# Patient Record
Sex: Male | Born: 1975 | Race: Black or African American | Hispanic: No | Marital: Single | Smoking: Never smoker
Health system: Southern US, Community
[De-identification: ages and names within clinical notes are randomized; demographics above are authoritative.]

## PROBLEM LIST (undated history)

## (undated) HISTORY — PX: CYST EXCISION: SHX5701

## (undated) HISTORY — PX: HERNIA REPAIR: SHX51

## (undated) HISTORY — PX: TONSILLECTOMY: SUR1361

---

## 2016-01-21 ENCOUNTER — Emergency Department (HOSPITAL_BASED_OUTPATIENT_CLINIC_OR_DEPARTMENT_OTHER): Payer: Self-pay

## 2016-01-21 ENCOUNTER — Emergency Department (HOSPITAL_BASED_OUTPATIENT_CLINIC_OR_DEPARTMENT_OTHER)
Admission: EM | Admit: 2016-01-21 | Discharge: 2016-01-21 | Disposition: A | Discharge status: Home / Self Care | Payer: Self-pay | Attending: Emergency Medicine | Admitting: Emergency Medicine

## 2016-01-21 ENCOUNTER — Encounter (HOSPITAL_BASED_OUTPATIENT_CLINIC_OR_DEPARTMENT_OTHER): Payer: Self-pay | Admitting: Emergency Medicine

## 2016-01-21 DIAGNOSIS — M5442 Lumbago with sciatica, left side: Secondary | ICD-10-CM | POA: Insufficient documentation

## 2016-01-21 DIAGNOSIS — M5432 Sciatica, left side: Secondary | ICD-10-CM

## 2016-01-21 MED ORDER — PREDNISONE 50 MG PO TABS
60.0000 mg | ORAL_TABLET | Freq: Once | ORAL | Status: AC
  Administered 2016-01-21: 60 mg via ORAL
  Filled 2016-01-21: qty 1

## 2016-01-21 MED ORDER — KETOROLAC TROMETHAMINE 60 MG/2ML IM SOLN
60.0000 mg | Freq: Once | INTRAMUSCULAR | Status: AC
  Administered 2016-01-21: 60 mg via INTRAMUSCULAR
  Filled 2016-01-21: qty 2

## 2016-01-21 MED ORDER — CYCLOBENZAPRINE HCL 10 MG PO TABS: 10.0000 mg | ORAL_TABLET | Freq: Three times a day (TID) | ORAL | Status: DC | PRN

## 2016-01-21 MED ORDER — OXYCODONE-ACETAMINOPHEN 5-325 MG PO TABS
1.0000 | ORAL_TABLET | Freq: Once | ORAL | Status: AC
  Administered 2016-01-21: 1 via ORAL
  Filled 2016-01-21: qty 1

## 2016-01-21 MED ORDER — PREDNISONE 50 MG PO TABS: 50.0000 mg | ORAL_TABLET | Freq: Every day | ORAL | Status: DC

## 2016-01-21 MED ORDER — TRAMADOL HCL 50 MG PO TABS: 50.0000 mg | ORAL_TABLET | Freq: Four times a day (QID) | ORAL | Status: DC | PRN

## 2016-01-21 NOTE — Discharge Instructions (Signed)
Return here as needed.  Use ice and heat on your lower back.  Follow-up with the orthopedist provided

## 2016-01-21 NOTE — ED Provider Notes (Signed)
CSN: [id number]     Arrival date & time 01/21/16  0930 History   First MD Initiated Contact with Patient 01/21/16 0940     Chief Complaint  Patient presents with  . Back Pain     (Consider location/radiation/quality/duration/timing/severity/associated sxs/prior Treatment) HPI Patient presents to the emergency department with lower back pain that radiates into the left lower extremity.  Patient states he was washing his car on Sunday when he bent over and felt a sudden sharp pain in his lower back.  He states since that time he has had worsening pain.  He states that reading to his left lower extremity.  The patient states that he did not take any medications prior to arrival.  Nothing seems to make the condition better.  He says movement and palpation make the pain worse.  He states that today he felt like he was more stiff and not is able to move. The patient denies chest pain, shortness of breath, headache,blurred vision, neck pain, fever, cough, weakness, numbness, dizziness, anorexia, edema, abdominal pain, nausea, vomiting, diarrhea, rash, back pain, dysuria, hematemesis, bloody stool, near syncope, or syncope. No past medical history on file. No past surgical history on file. No family history on file. Social History  Substance Use Topics  . Smoking status: Never Smoker   . Smokeless tobacco: None  . Alcohol Use: None    Review of Systems  All other systems negative except as documented in the HPI. All pertinent positives and negatives as reviewed in the HPI.  Allergies  Review of patient's allergies indicates no known allergies.  Home Medications   Prior to Admission medications   Not on File   BP 129/85 mmHg  Temp(Src) 98.2 F (36.8 C) (Oral)  Resp 16  Ht  (1.854 m)  Wt 107.502 kg  BMI 31.27 kg/m2  SpO2 99% Physical Exam  Constitutional: He is oriented to person, place, and time. He appears well-developed and well-nourished. No distress.  HENT:  Head:  Normocephalic and atraumatic.  Mouth/Throat: Oropharynx is clear and moist.  Eyes: Pupils are equal, round, and reactive to light.  Neck: Normal range of motion. Neck supple.  Cardiovascular: Normal rate, regular rhythm and normal heart sounds.  Exam reveals no gallop and no friction rub.   No murmur heard. Pulmonary/Chest: Effort normal and breath sounds normal. No respiratory distress. He has no wheezes.  Musculoskeletal:       Lumbar back: He exhibits tenderness and pain. He exhibits normal range of motion, no bony tenderness, no swelling, no edema and no spasm.       Back:  Neurological: He is alert and oriented to person, place, and time. He has normal strength. No cranial nerve deficit or sensory deficit. He exhibits normal muscle tone. Coordination and gait normal. GCS eye subscore is 4. GCS verbal subscore is 5. GCS motor subscore is 6.  Skin: Skin is warm and dry. No rash noted. No erythema.  Psychiatric: He has a normal mood and affect. His behavior is normal.  Nursing note and vitals reviewed.   ED Course  Procedures (including critical care time) Labs Review Labs Reviewed - No data to display  Imaging Review Dg Lumbar Spine Complete  01/21/2016  CLINICAL DATA:  Three-day history of lumbago.  No known injury EXAM: LUMBAR SPINE - COMPLETE 4+ VIEW COMPARISON:  None. FINDINGS: Frontal, lateral, spot lumbosacral lateral, and bilateral oblique views were obtained. There are 5 non-rib-bearing lumbar type vertebral bodies. There is no fracture or spondylolisthesis.  The disc spaces appear normal. There is no appreciable facet arthropathy. IMPRESSION: No fracture or spondylolisthesis. No appreciable arthropathic change. Electronically Signed   By: Bretta BangWilliam  Woodruff III M.D.   On: 01/21/2016 10:37   I have personally reviewed and evaluated these images and lab results as part of my medical decision-making.   EKG Interpretation None       Patient be treated for sciatica advised  follow-up with a primary care Dr. told to use ice and heat on his lower back muscles.  Advised him to avoid heavy lifting and bending over to pick things up at the waist.  Patient agrees the plan and all questions were answered  Charlestine NightChristopher Erian Lariviere, PA-C 01/21/16 1056  Geoffery Lyonsouglas Delo, MD 01/21/16 1057

## 2016-01-21 NOTE — ED Notes (Signed)
Lower back pain radiating down left leg.  No known injury.  No numbness or tingling.  No elimination problems.

## 2016-10-21 ENCOUNTER — Emergency Department (HOSPITAL_BASED_OUTPATIENT_CLINIC_OR_DEPARTMENT_OTHER)
Admission: EM | Admit: 2016-10-21 | Discharge: 2016-10-21 | Disposition: A | Discharge status: Home / Self Care | Payer: 59 | Attending: Emergency Medicine | Admitting: Emergency Medicine

## 2016-10-21 ENCOUNTER — Encounter (HOSPITAL_BASED_OUTPATIENT_CLINIC_OR_DEPARTMENT_OTHER): Payer: Self-pay | Admitting: Emergency Medicine

## 2016-10-21 DIAGNOSIS — R109 Unspecified abdominal pain: Secondary | ICD-10-CM | POA: Insufficient documentation

## 2016-10-21 DIAGNOSIS — M791 Myalgia: Secondary | ICD-10-CM | POA: Diagnosis not present

## 2016-10-21 DIAGNOSIS — R110 Nausea: Secondary | ICD-10-CM | POA: Diagnosis not present

## 2016-10-21 DIAGNOSIS — Z79899 Other long term (current) drug therapy: Secondary | ICD-10-CM | POA: Diagnosis not present

## 2016-10-21 DIAGNOSIS — R197 Diarrhea, unspecified: Secondary | ICD-10-CM | POA: Insufficient documentation

## 2016-10-21 MED ORDER — DICYCLOMINE HCL 10 MG PO CAPS
10.0000 mg | ORAL_CAPSULE | Freq: Once | ORAL | Status: AC
  Administered 2016-10-21: 10 mg via ORAL
  Filled 2016-10-21: qty 1

## 2016-10-21 MED ORDER — DICYCLOMINE HCL 20 MG PO TABS
20.0000 mg | ORAL_TABLET | Freq: Two times a day (BID) | ORAL | 0 refills | Status: DC | PRN
Start: 2016-10-21 — End: 2019-10-27

## 2016-10-21 MED ORDER — ONDANSETRON HCL 4 MG PO TABS: 4.0000 mg | ORAL_TABLET | Freq: Three times a day (TID) | ORAL | 0 refills | Status: DC | PRN

## 2016-10-21 MED ORDER — ONDANSETRON 4 MG PO TBDP
4.0000 mg | ORAL_TABLET | Freq: Once | ORAL | Status: AC
  Administered 2016-10-21: 4 mg via ORAL
  Filled 2016-10-21: qty 1

## 2016-10-21 NOTE — ED Triage Notes (Signed)
Nausea and frequent belching with diarrhea since yesterday. Abd cramping

## 2016-10-21 NOTE — ED Provider Notes (Signed)
[facility]   CSN: [id number] Arrival date & time: 10/21/16  0805     History   Chief Complaint Chief Complaint  Patient presents with  . Diarrhea    HPI Danny Robinson is a 41 y.o. male.  HPI Patient presents with 2 days of diarrhea. States the diarrhea is nonbloody and nonbilious. Had 3-4 episodes yesterday and one this morning. Associated with stomach cramps that improved after bowel movement. He's also had some nausea but denies any vomiting. He does admit to belching. Denies fever but has had some chills. Denies any recent travel or food intake that was questionable. States that there are several people at his work have been sick with similar symptoms. History reviewed. No pertinent past medical history.  There are no active problems to display for this patient.   Past Surgical History:  Procedure Laterality Date  . CYST EXCISION         Home Medications    Prior to Admission medications   Medication Sig Start Date End Date Taking? Authorizing Provider  cyclobenzaprine (FLEXERIL) 10 MG tablet Take 1 tablet (10 mg total) by mouth 3 (three) times daily as needed for muscle spasms. 01/21/16   Charlestine Night, PA-C  dicyclomine (BENTYL) 20 MG tablet Take 1 tablet (20 mg total) by mouth 2 (two) times daily as needed for spasms. 10/21/16   Loren Racer, MD  ondansetron (ZOFRAN) 4 MG tablet Take 1 tablet (4 mg total) by mouth every 8 (eight) hours as needed for nausea or vomiting. 10/21/16   Loren Racer, MD  predniSONE (DELTASONE) 50 MG tablet Take 1 tablet (50 mg total) by mouth daily with breakfast. 01/21/16   Charlestine Night, PA-C  traMADol (ULTRAM) 50 MG tablet Take 1 tablet (50 mg total) by mouth every 6 (six) hours as needed for severe pain. 01/21/16   Charlestine Night, PA-C    Family History No family history on file.  Social History Social History  Substance Use Topics  . Smoking status: Never Smoker  . Smokeless tobacco:  Never Used  . Alcohol use No     Allergies   Patient has no known allergies.   Review of Systems Review of Systems  Constitutional: Positive for chills. Negative for fever.  HENT: Negative for congestion, rhinorrhea and sore throat.   Respiratory: Negative for cough and shortness of breath.   Cardiovascular: Negative for chest pain.  Gastrointestinal: Positive for abdominal pain, diarrhea and nausea. Negative for abdominal distention, blood in stool and vomiting.  Genitourinary: Negative for dysuria and flank pain.  Musculoskeletal: Positive for myalgias. Negative for arthralgias, neck pain and neck stiffness.  Skin: Negative for rash and wound.  Neurological: Negative for dizziness, weakness, light-headedness, numbness and headaches.  All other systems reviewed and are negative.    Physical Exam Updated Vital Signs BP 143/77 (BP Location: Left Arm)   Pulse 70   Temp 98.1 F (36.7 C) (Oral)   Resp 18   Ht 6\' 1"  (1.854 m)   Wt 245 lb (111.1 kg)   SpO2 99%   BMI 32.32 kg/m   Physical Exam  Constitutional: He is oriented to person, place, and time. He appears well-developed and well-nourished.  HENT:  Head: Normocephalic and atraumatic.  Mouth/Throat: Oropharynx is clear and moist. No oropharyngeal exudate.  Eyes: EOM are normal. Pupils are equal, round, and reactive to light.  Neck: Normal range of motion. Neck supple.  No meningismus  Cardiovascular: Normal rate and regular rhythm.  Exam reveals  no gallop and no friction rub.   No murmur heard. Pulmonary/Chest: Effort normal and breath sounds normal. No respiratory distress. He has no wheezes. He has no rales. He exhibits no tenderness.  Abdominal: Soft. Bowel sounds are normal. There is no tenderness. There is no rebound and no guarding.  Abdomen is soft and nontender. No rebound or guarding. Increased bowel sounds.  Musculoskeletal: Normal range of motion. He exhibits no edema or tenderness.  No CVA tenderness. No  midline thoracic or lumbar tenderness. No lower extremity swelling or asymmetry.  Lymphadenopathy:    He has no cervical adenopathy.  Neurological: He is alert and oriented to person, place, and time.  Skin: Skin is warm and dry. Capillary refill takes less than 2 seconds. No rash noted. No erythema.  Psychiatric: He has a normal mood and affect. His behavior is normal.  Nursing note and vitals reviewed.    ED Treatments / Results  Labs (all labs ordered are listed, but only abnormal results are displayed) Labs Reviewed - No data to display  EKG  EKG Interpretation None       Radiology No results found.  Procedures Procedures (including critical care time)  Medications Ordered in ED Medications  ondansetron (ZOFRAN-ODT) disintegrating tablet 4 mg (4 mg Oral Given 10/21/16 0914)  dicyclomine (BENTYL) capsule 10 mg (10 mg Oral Given 10/21/16 0915)     Initial Impression / Assessment and Plan / ED Course  I have reviewed the triage vital signs and the nursing notes.  Pertinent labs & imaging results that were available during my care of the patient were reviewed by me and considered in my medical decision making (see chart for details).     Likely enteritis. We'll treat symptomatically. Patient appears well-hydrated. Encouraged to continue to drink plenty of fluids. Abdominal exam is benign. Low suspicion for surgical emergency. Return precautions have been given.  Final Clinical Impressions(s) / ED Diagnoses   Final diagnoses:  Diarrhea, unspecified type    New Prescriptions New Prescriptions   DICYCLOMINE (BENTYL) 20 MG TABLET    Take 1 tablet (20 mg total) by mouth 2 (two) times daily as needed for spasms.   ONDANSETRON (ZOFRAN) 4 MG TABLET    Take 1 tablet (4 mg total) by mouth every 8 (eight) hours as needed for nausea or vomiting.     Loren Raceravid Oneta Sigman, MD 10/21/16 [id number]

## 2017-05-22 ENCOUNTER — Emergency Department (HOSPITAL_BASED_OUTPATIENT_CLINIC_OR_DEPARTMENT_OTHER)
Admission: EM | Admit: 2017-05-22 | Discharge: 2017-05-22 | Disposition: A | Discharge status: Home / Self Care | Payer: 59 | Attending: Emergency Medicine | Admitting: Emergency Medicine

## 2017-05-22 ENCOUNTER — Encounter (HOSPITAL_BASED_OUTPATIENT_CLINIC_OR_DEPARTMENT_OTHER): Payer: Self-pay

## 2017-05-22 DIAGNOSIS — Z79899 Other long term (current) drug therapy: Secondary | ICD-10-CM | POA: Insufficient documentation

## 2017-05-22 DIAGNOSIS — M79674 Pain in right toe(s): Secondary | ICD-10-CM | POA: Diagnosis present

## 2017-05-22 DIAGNOSIS — B353 Tinea pedis: Secondary | ICD-10-CM | POA: Insufficient documentation

## 2017-05-22 NOTE — ED Provider Notes (Signed)
[facility]   CSN: [id number] Arrival date & time: 05/22/17  1102     History   Chief Complaint Chief Complaint  Patient presents with  . Toe Pain    HPI Danny Robinson is a 41 y.o. male who presents emergency Department with chief complaint of third toe pain. He has had 2 weeks of progressively worsening pain. 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 He has a lot of foot pain. He has noticed significantly worsening pain. It goes away in the morning however after he is on his feet for a long period of time it begins to hurt. He denies any redness, swelling or signs of infection. Has no injury to the toe.Marland Kitchen  HPI  History reviewed. No pertinent past medical history.  There are no active problems to display for this patient.   Past Surgical History:  Procedure Laterality Date  . CYST EXCISION         Home Medications    Prior to Admission medications   Medication Sig Start Date End Date Taking? Authorizing Provider  cyclobenzaprine (FLEXERIL) 10 MG tablet Take 1 tablet (10 mg total) by mouth 3 (three) times daily as needed for muscle spasms. 01/21/16   Lawyer, Cristal Deer, PA-C  dicyclomine (BENTYL) 20 MG tablet Take 1 tablet (20 mg total) by mouth 2 (two) times daily as needed for spasms. 10/21/16   Loren Racer, MD  ondansetron (ZOFRAN) 4 MG tablet Take 1 tablet (4 mg total) by mouth every 8 (eight) hours as needed for nausea or vomiting. 10/21/16   Loren Racer, MD  predniSONE (DELTASONE) 50 MG tablet Take 1 tablet (50 mg total) by mouth daily with breakfast. 01/21/16   Lawyer, Cristal Deer, PA-C  traMADol (ULTRAM) 50 MG tablet Take 1 tablet (50 mg total) by mouth every 6 (six) hours as needed for severe pain. 01/21/16   Charlestine Night, PA-C    Family History History reviewed. No pertinent family history.  Social History Social History  Substance Use Topics  .  Smoking status: Never Smoker  . Smokeless tobacco: Never Used  . Alcohol use No     Allergies   Patient has no known allergies.   Review of Systems Review of Systems Positive for toe pain, negative for fever, chills or swelling  Physical Exam Updated Vital Signs BP 128/75 (BP Location: Left Arm)   Pulse 84   Temp 98.2 F (36.8 C) (Oral)   Resp 20   Ht  (1.854 m)   Wt 108.9 kg (240 lb)   SpO2 98%   BMI 31.66 kg/m   Physical Exam  Constitutional: He appears well-developed and well-nourished. No distress.  HENT:  Head: Normocephalic and atraumatic.  Eyes: Conjunctivae are normal. No scleral icterus.  Neck: Normal range of motion. Neck supple.  Cardiovascular: Normal rate, regular rhythm and normal heart sounds.   Pulmonary/Chest: Effort normal and breath sounds normal. No respiratory distress.  Abdominal: Soft. There is no tenderness.  Musculoskeletal: He exhibits no edema.  Bilateral pes planus, macerated tissue between the toes. Bilateral bunions. The third toe has a medial side callus. It is tender to palpation, no signs of infection. Full range of motion of the joint  Neurological: He is alert.  Skin: Skin is warm and dry. He is not diaphoretic.  Psychiatric: His behavior is normal.  Nursing note and vitals reviewed.    ED Treatments /  Results  Labs (all labs ordered are listed, but only abnormal results are displayed) Labs Reviewed - No data to display  EKG  EKG Interpretation None       Radiology No results found.  Procedures Procedures (including critical care time)  Medications Ordered in ED Medications - No data to display   Initial Impression / Assessment and Plan / ED Course  I have reviewed the triage vital signs and the nursing notes.  Pertinent labs & imaging results that were available during my care of the patient were reviewed by me and considered in my medical decision making (see chart for details).     Patient with  athlete's foot and a callus development on the third toe of the right foot. No signs of infection, no signs of trauma. Patient is discharged to use moleskin over the callus, treat the athlete's foot with Lamisil and follow-up with podiatry. I discussed return precautions with the patient appears safe for discharge at this time  Final Clinical Impressions(s) / ED Diagnoses   Final diagnoses:  None    New Prescriptions New Prescriptions   No medications on file     Arthor Captain, PA-C 05/22/17 1206    Nira Conn, MD 05/25/17 [phone]

## 2017-05-22 NOTE — ED Triage Notes (Signed)
Right third toe pain x2 weeks. Patient denies pain related to injury.

## 2017-05-22 NOTE — Discharge Instructions (Signed)
Apply moleskin to the toe Use lamisil for treatment of the athlete's foot FOLLOW UP WITH DR. Logan Bores

## 2017-06-07 ENCOUNTER — Ambulatory Visit: Payer: 59 | Admitting: Podiatry

## 2019-10-27 ENCOUNTER — Emergency Department (HOSPITAL_BASED_OUTPATIENT_CLINIC_OR_DEPARTMENT_OTHER)
Admission: EM | Admit: 2019-10-27 | Discharge: 2019-10-27 | Disposition: A | Discharge status: Home / Self Care | Payer: Self-pay | Attending: Emergency Medicine | Admitting: Emergency Medicine

## 2019-10-27 ENCOUNTER — Encounter (HOSPITAL_BASED_OUTPATIENT_CLINIC_OR_DEPARTMENT_OTHER): Payer: Self-pay | Admitting: Emergency Medicine

## 2019-10-27 DIAGNOSIS — Z202 Contact with and (suspected) exposure to infections with a predominantly sexual mode of transmission: Secondary | ICD-10-CM | POA: Insufficient documentation

## 2019-10-27 MED ORDER — METRONIDAZOLE 500 MG PO TABS
2000.0000 mg | ORAL_TABLET | Freq: Once | ORAL | Status: AC
  Administered 2019-10-27: 2000 mg via ORAL
  Filled 2019-10-27: qty 4

## 2019-10-27 NOTE — ED Triage Notes (Signed)
Pt states he needs to be tested and treated for an STD  Pt's significant other tested positive for trich

## 2019-10-27 NOTE — ED Provider Notes (Signed)
[facility] Provider Note: Georgena Spurling, MD, FACEP  CSN: [id number] MRN: [id number] ARRIVAL: 10/27/19 at 0312 ROOM: [facility]  Exposure to STD   HISTORY OF PRESENT ILLNESS  10/27/19 3:25 AM Danny Robinson is a 44 y.o. male who is here to be tested and treated for STDs.  Specifically I diagnosed his significant other with trichomoniasis earlier this morning and advised he present for treatment.  He denies any symptoms.  Specifically he denies abdominal pain, dysuria, urethral discharge or stains in his underwear.   History reviewed. No pertinent past medical history.  Past Surgical History:  Procedure Laterality Date  . CYST EXCISION      History reviewed. No pertinent family history.  Social History   Tobacco Use  . Smoking status: Never Smoker  . Smokeless tobacco: Never Used  Substance Use Topics  . Alcohol use: Yes    Comment: social   . Drug use: Yes    Types: Marijuana    Prior to Admission medications   Medication Sig Start Date End Date Taking? Authorizing Provider  cyclobenzaprine (FLEXERIL) 10 MG tablet Take 1 tablet (10 mg total) by mouth 3 (three) times daily as needed for muscle spasms. 01/21/16   Lawyer, Harrell Gave, PA-C  dicyclomine (BENTYL) 20 MG tablet Take 1 tablet (20 mg total) by mouth 2 (two) times daily as needed for spasms. 10/21/16   Julianne Rice, MD  ondansetron (ZOFRAN) 4 MG tablet Take 1 tablet (4 mg total) by mouth every 8 (eight) hours as needed for nausea or vomiting. 10/21/16   Julianne Rice, MD  predniSONE (DELTASONE) 50 MG tablet Take 1 tablet (50 mg total) by mouth daily with breakfast. 01/21/16   Lawyer, Harrell Gave, PA-C  traMADol (ULTRAM) 50 MG tablet Take 1 tablet (50 mg total) by mouth every 6 (six) hours as needed for severe pain. 01/21/16   Dalia Heading, PA-C    Allergies Patient has no known allergies.   REVIEW OF SYSTEMS  Negative except as noted here or in the History of Present  Illness.   PHYSICAL EXAMINATION  Initial Vital Signs Blood pressure (!) 149/87, pulse 66, temperature 98.5 F (36.9 C), temperature source Oral, resp. rate 18, height 6\' 1"  (1.854 m), weight 111.1 kg, SpO2 99 %.  Examination General: Well-developed, well-nourished male in no acute distress; appearance consistent with age of record HENT: normocephalic; atraumatic Eyes: Normal appearance Neck: supple Heart: regular rate and rhythm Lungs: clear to auscultation bilaterally Abdomen: soft; nondistended; nontender; bowel sounds present GU: Clear, yellow urine Extremities: No deformity; full range of motion Neurologic: Awake, alert and oriented; motor function intact in all extremities and symmetric; no facial droop Skin: Warm and dry Psychiatric: Normal mood and affect   RESULTS  Summary of this visit's results, reviewed and interpreted by myself:   EKG Interpretation  Date/Time:    Ventricular Rate:    PR Interval:    QRS Duration:   QT Interval:    QTC Calculation:   R Axis:     Text Interpretation:        Laboratory Studies: No results found for this or any previous visit (from the past 24 hour(s)). Imaging Studies: No results found.  ED COURSE and MDM  Nursing notes, initial and subsequent vitals signs, including pulse oximetry, reviewed and interpreted by myself.  Vitals:   10/27/19 0321 10/27/19 0322  BP: (!) 149/87   Pulse: 66   Resp: 18   Temp: 98.5 F (36.9 C)  TempSrc: Oral   SpO2: 99%   Weight:  111.1 kg  Height:  6\' 1"  (1.854 m)   Medications  metroNIDAZOLE (FLAGYL) tablet 2,000 mg (has no administration in time range)   Patient given Flagyl 2 g for treatment of trichomoniasis.  Gonorrhea and Chlamydia testing pending.   PROCEDURES  Procedures   ED DIAGNOSES     ICD-10-CM   1. Exposure to STD  Z20.2        Marilyn Nihiser, , MD 10/27/19 0330

## 2019-10-28 LAB — GC/CHLAMYDIA PROBE AMP (~~LOC~~) NOT AT ARMC
Chlamydia: NEGATIVE
Neisseria Gonorrhea: NEGATIVE

## 2020-02-25 ENCOUNTER — Other Ambulatory Visit: Payer: Self-pay

## 2020-02-25 ENCOUNTER — Encounter (HOSPITAL_BASED_OUTPATIENT_CLINIC_OR_DEPARTMENT_OTHER): Payer: Self-pay | Admitting: Emergency Medicine

## 2020-02-25 ENCOUNTER — Emergency Department (HOSPITAL_BASED_OUTPATIENT_CLINIC_OR_DEPARTMENT_OTHER)
Admission: EM | Admit: 2020-02-25 | Discharge: 2020-02-25 | Disposition: A | Discharge status: Home / Self Care | Payer: BC Managed Care – PPO | Attending: Emergency Medicine | Admitting: Emergency Medicine

## 2020-02-25 DIAGNOSIS — R1033 Periumbilical pain: Secondary | ICD-10-CM

## 2020-02-25 LAB — URINALYSIS, ROUTINE W REFLEX MICROSCOPIC
Bilirubin Urine: NEGATIVE
Glucose, UA: NEGATIVE mg/dL
Hgb urine dipstick: NEGATIVE
Ketones, ur: NEGATIVE mg/dL
Leukocytes,Ua: NEGATIVE
Nitrite: NEGATIVE
Protein, ur: NEGATIVE mg/dL
Specific Gravity, Urine: >1.03 — ABNORMAL HIGH (ref 1.005–1.030)
pH: 5.5 (ref 5.0–8.0)

## 2020-02-25 NOTE — ED Provider Notes (Signed)
[facility] EMERGENCY DEPARTMENT Provider Note   CSN: [id number] Arrival date & time: 02/25/20  1740     History Chief Complaint  Patient presents with  . Abdominal Pain    Danny Robinson is a 44 y.o. male.  44 yo M with a chief complaints of abdominal pain.  This is sharp and shooting and periumbilical.  Usually worse with coughing or sneezing.  Been going on for about 6 months off and on.  Has seen another provider for this back in December and was started on an anti-inflammatory medicine.  He looked it up on the Internet and decided that he did have arthritis and so he stopped taking it.  He did not think that helped that much.  He has never noticed a mass or bulge.  He has researched this on the Internet and thought it could be a hernia.  He is also discussed this with friends and family members to also felt it might be a hernia.  He denies any penile pain or testicular pain.  Denies mass or bulge to his testicles.  Denies trauma to the abdomen.  Denies nausea vomiting or diarrhea.  Denies any relation to food.  The history is provided by the patient.  Abdominal Pain Pain location:  Periumbilical Pain quality: aching and sharp   Pain quality comment:  Ripping Pain radiates to:  Does not radiate Pain severity:  Moderate Onset quality:  Gradual Duration:  26 weeks Timing:  Intermittent Progression:  Unchanged Chronicity:  New Relieved by:  Nothing Worsened by:  Coughing (sneezing) Ineffective treatments:  NSAIDs Associated symptoms: no anorexia, no chest pain, no chills, no diarrhea, no fever, no nausea, no shortness of breath and no vomiting        History reviewed. No pertinent past medical history.  There are no problems to display for this patient.   Past Surgical History:  Procedure Laterality Date  . CYST EXCISION         No family history on file.  Social History   Tobacco Use  . Smoking status: Never Smoker  . Smokeless tobacco: Never Used   Vaping Use  . Vaping Use: Never used  Substance Use Topics  . Alcohol use: Yes    Comment: social (rare)  . Drug use: Yes    Types: Marijuana    Comment: once a week    Home Medications Prior to Admission medications   Medication Sig Start Date End Date Taking? Authorizing Provider  dicyclomine (BENTYL) 20 MG tablet Take 1 tablet (20 mg total) by mouth 2 (two) times daily as needed for spasms. 10/21/16 10/27/19  Julianne Rice, MD    Allergies    Patient has no known allergies.  Review of Systems   Review of Systems  Constitutional: Negative for chills and fever.  HENT: Negative for congestion and facial swelling.   Eyes: Negative for discharge and visual disturbance.  Respiratory: Negative for shortness of breath.   Cardiovascular: Negative for chest pain and palpitations.  Gastrointestinal: Positive for abdominal pain. Negative for anorexia, diarrhea, nausea and vomiting.  Musculoskeletal: Negative for arthralgias and myalgias.  Skin: Negative for color change and rash.  Neurological: Negative for tremors, syncope and headaches.  Psychiatric/Behavioral: Negative for confusion and dysphoric mood.    Physical Exam Updated Vital Signs BP 120/81 (BP Location: Right Arm)   Pulse 62   Temp 98.2 F (36.8 C) (Oral)   Resp 18   Ht 6\' 1"  (1.854 m)   Wt 106.6  kg   SpO2 97%   BMI 31.00 kg/m   Physical Exam Vitals and nursing note reviewed.  Constitutional:      Appearance: He is well-developed.  HENT:     Head: Normocephalic and atraumatic.  Eyes:     Pupils: Pupils are equal, round, and reactive to light.  Neck:     Vascular: No JVD.  Cardiovascular:     Rate and Rhythm: Normal rate and regular rhythm.     Heart sounds: No murmur heard.  No friction rub. No gallop.   Pulmonary:     Effort: No respiratory distress.     Breath sounds: No wheezing.  Abdominal:     General: There is no distension.     Tenderness: There is no abdominal tenderness. There is no  guarding or rebound.     Comments: Deep palpation in the abdomen without obvious defect.  No appreciable masses.  Upon standing and bearing down there is also no mass or defect palpable.  Musculoskeletal:        General: Normal range of motion.     Cervical back: Normal range of motion and neck supple.  Skin:    Coloration: Skin is not pale.     Findings: No rash.  Neurological:     Mental Status: He is alert and oriented to person, place, and time.  Psychiatric:        Behavior: Behavior normal.     ED Results / Procedures / Treatments   Labs (all labs ordered are listed, but only abnormal results are displayed) Labs Reviewed  URINALYSIS, ROUTINE W REFLEX MICROSCOPIC - Abnormal; Notable for the following components:      Result Value   APPearance HAZY (*)    Specific Gravity, Urine >1.030 (*)    All other components within normal limits  URINE CULTURE    EKG None  Radiology No results found.  Procedures Procedures (including critical care time)  Medications Ordered in ED Medications - No data to display  ED Course  I have reviewed the triage vital signs and the nursing notes.  Pertinent labs & imaging results that were available during my care of the patient were reviewed by me and considered in my medical decision making (see chart for details).    MDM Rules/Calculators/A&P                          44 yo M with a chief complaints of periumbilical abdominal pain.  Going on for about 6 months off and on.  Could be a periumbilical hernia though there is no defect that is palpated he is never seen a mass.  Could also be a muscular strain based on his work that requires heavy lifting.  I do not feel that a laboratory evaluation would be helpful in the acute setting.  Imaging also I feel would be of limited utility.  I discussed this at length with the patient.  He had a urine sample that was negative for infection but was somewhat concentrated.  We will have him follow-up  with a family doctor as he does not currently have one.  Return precautions given.  7:16 AM:  I have discussed the diagnosis/risks/treatment options with the patient and family and believe the pt to be eligible for discharge home to follow-up with PCP. We also discussed returning to the ED immediately if new or worsening sx occur. We discussed the sx which are most concerning (e.g., sudden  worsening pain, fever, inability to tolerate by mouth) that necessitate immediate return. Medications administered to the patient during their visit and any new prescriptions provided to the patient are listed below.  Medications given during this visit Medications - No data to display   The patient appears reasonably screen and/or stabilized for discharge and I doubt any other medical condition or other [facility] requiring further screening, evaluation, or treatment in the ED at this time prior to discharge.   Final Clinical Impression(s) / ED Diagnoses Final diagnoses:  Periumbilical abdominal pain    Rx / DC Orders ED Discharge Orders    None       Melene Plan, DO 02/25/20 [phone]

## 2020-02-25 NOTE — ED Notes (Signed)
ED Provider at bedside. 

## 2020-02-25 NOTE — Discharge Instructions (Signed)
Take 4 over the counter ibuprofen tablets 3 times a day or 2 over-the-counter naproxen tablets twice a day for pain. Also take tylenol 1000mg (2 extra strength) four times a day.   You could have a hernia, though I did not feel one or see one on exam.  Usually if there is no bulging this is not something that a surgeon would try and fix.  Try Tylenol and ibuprofen or naproxen to see if this helps you with your pain.  Is also could be a muscular strain that could be due to the way that you lift things or something associated with your job.  Please follow-up with the family doctor so that they can keep track of this and ensure that it is improving.  We have a hotline number that you can call to try and find a doctor or there is a doctor's office that is upstairs in this building.  Please return to the emergency department for fever or worsening pain or inability to eat or drink.  You should also return if you do notice a bulge that does not go back in.

## 2020-02-25 NOTE — ED Triage Notes (Signed)
Pt reports "whenever I sneeze or cough I feel like I have a rip in my stomach" x 6 months. Pinpoints pain to umbilicus. Denies n/v/d or fever. Denies pain at rest.

## 2020-02-26 LAB — URINE CULTURE: Culture: NO GROWTH

## 2021-05-24 ENCOUNTER — Encounter (HOSPITAL_BASED_OUTPATIENT_CLINIC_OR_DEPARTMENT_OTHER): Payer: Self-pay | Admitting: *Deleted

## 2021-05-24 ENCOUNTER — Other Ambulatory Visit: Payer: Self-pay

## 2021-05-24 ENCOUNTER — Emergency Department (HOSPITAL_BASED_OUTPATIENT_CLINIC_OR_DEPARTMENT_OTHER)
Admission: EM | Admit: 2021-05-24 | Discharge: 2021-05-24 | Disposition: A | Discharge status: Home / Self Care | Payer: BC Managed Care – PPO | Attending: Emergency Medicine | Admitting: Emergency Medicine

## 2021-05-24 ENCOUNTER — Emergency Department (HOSPITAL_BASED_OUTPATIENT_CLINIC_OR_DEPARTMENT_OTHER): Payer: BC Managed Care – PPO

## 2021-05-24 DIAGNOSIS — R059 Cough, unspecified: Secondary | ICD-10-CM | POA: Diagnosis not present

## 2021-05-24 DIAGNOSIS — K4090 Unilateral inguinal hernia, without obstruction or gangrene, not specified as recurrent: Secondary | ICD-10-CM | POA: Insufficient documentation

## 2021-05-24 DIAGNOSIS — R1031 Right lower quadrant pain: Secondary | ICD-10-CM | POA: Diagnosis present

## 2021-05-24 LAB — URINALYSIS, ROUTINE W REFLEX MICROSCOPIC
Bilirubin Urine: NEGATIVE
Glucose, UA: NEGATIVE mg/dL
Hgb urine dipstick: NEGATIVE
Ketones, ur: 15 mg/dL — AB
Nitrite: NEGATIVE
Protein, ur: 30 mg/dL — AB
Specific Gravity, Urine: 1.025 (ref 1.005–1.030)
pH: 6 (ref 5.0–8.0)

## 2021-05-24 LAB — CBC WITH DIFFERENTIAL/PLATELET
Abs Immature Granulocytes: 0.04 10*3/uL (ref 0.00–0.07)
Basophils Absolute: 0 10*3/uL (ref 0.0–0.1)
Basophils Relative: 0 %
Eosinophils Absolute: 0.2 10*3/uL (ref 0.0–0.5)
Eosinophils Relative: 3 %
HCT: 44.6 % (ref 39.0–52.0)
Hemoglobin: 14.3 g/dL (ref 13.0–17.0)
Immature Granulocytes: 1 %
Lymphocytes Relative: 32 %
Lymphs Abs: 2.3 10*3/uL (ref 0.7–4.0)
MCH: 23.4 pg — ABNORMAL LOW (ref 26.0–34.0)
MCHC: 32.1 g/dL (ref 30.0–36.0)
MCV: 72.9 fL — ABNORMAL LOW (ref 80.0–100.0)
Monocytes Absolute: 0.7 10*3/uL (ref 0.1–1.0)
Monocytes Relative: 11 %
Neutro Abs: 3.8 10*3/uL (ref 1.7–7.7)
Neutrophils Relative %: 53 %
Platelets: 221 10*3/uL (ref 150–400)
RBC: 6.12 MIL/uL — ABNORMAL HIGH (ref 4.22–5.81)
RDW: 14.5 % (ref 11.5–15.5)
WBC: 7.1 10*3/uL (ref 4.0–10.5)
nRBC: 0 % (ref 0.0–0.2)

## 2021-05-24 LAB — URINALYSIS, MICROSCOPIC (REFLEX)

## 2021-05-24 LAB — COMPREHENSIVE METABOLIC PANEL
ALT: 22 U/L (ref 0–44)
AST: 17 U/L (ref 15–41)
Albumin: 4.4 g/dL (ref 3.5–5.0)
Alkaline Phosphatase: 71 U/L (ref 38–126)
Anion gap: 8 (ref 5–15)
BUN: 10 mg/dL (ref 6–20)
CO2: 28 mmol/L (ref 22–32)
Calcium: 9 mg/dL (ref 8.9–10.3)
Chloride: 101 mmol/L (ref 98–111)
Creatinine, Ser: 1.19 mg/dL (ref 0.61–1.24)
GFR, Estimated: >60 mL/min (ref >60)
Glucose, Bld: 107 mg/dL — ABNORMAL HIGH (ref 70–99)
Potassium: 4.1 mmol/L (ref 3.5–5.1)
Sodium: 137 mmol/L (ref 135–145)
Total Bilirubin: 0.3 mg/dL (ref 0.3–1.2)
Total Protein: 8 g/dL (ref 6.5–8.1)

## 2021-05-24 MED ORDER — NAPROXEN 500 MG PO TABS: 500.0000 mg | ORAL_TABLET | Freq: Two times a day (BID) | ORAL | 0 refills | Status: AC | PRN

## 2021-05-24 MED ORDER — IOHEXOL 350 MG/ML SOLN
100.0000 mL | Freq: Once | INTRAVENOUS | Status: AC | PRN
  Administered 2021-05-24: 100 mL via INTRAVENOUS

## 2021-05-24 NOTE — ED Triage Notes (Signed)
Swelling to right groin x several days. Denies difficulty with urination. 'bulge gets more tender with coughing'.

## 2021-05-24 NOTE — Discharge Instructions (Signed)
Your hernia contains only fat and no intestine.  It may be sore from time to time which should improve with anti-inflammatories.  Follow-up with the surgeon if you wish to have this repaired.  Return to the ED with worsening pain, fever, vomiting, any other concerns.

## 2021-05-24 NOTE — ED Provider Notes (Signed)
[facility] EMERGENCY DEPARTMENT Provider Note   CSN: [id number] Arrival date & time: 05/24/21  0109     History Chief Complaint  Patient presents with   Hernia   Groin Pain    Danny Robinson is a 45 y.o. male.  Patient with a 2-day history of bulge to his right groin he is concerned could be a hernia.  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  Does have some pain with coughing but otherwise pathology is nontender.  Denies any vomiting.  Denies any change in bowel habits.  No pain with urination or blood in the urine.  No chest pain or shortness of breath. No previous abdominal surgeries. No scrotal swelling or testicular pain.  The history is provided by the patient.  Groin Pain Associated symptoms include abdominal pain. Pertinent negatives include no shortness of breath.      History reviewed. No pertinent past medical history.  There are no problems to display for this patient.   Past Surgical History:  Procedure Laterality Date   CYST EXCISION         History reviewed. No pertinent family history.  Social History   Tobacco Use   Smoking status: Never   Smokeless tobacco: Never  Vaping Use   Vaping Use: Never used  Substance Use Topics   Alcohol use: Yes    Comment: social (rare)   Drug use: Yes    Types: Marijuana    Comment: once a week    Home Medications Prior to Admission medications   Medication Sig Start Date End Date Taking? Authorizing Provider  dicyclomine (BENTYL) 20 MG tablet Take 1 tablet (20 mg total) by mouth 2 (two) times daily as needed for spasms. 10/21/16 10/27/19  Loren Racer, MD    Allergies    Patient has no known allergies.  Review of Systems   Review of Systems  Constitutional:  Negative for activity change, appetite change and fever.  HENT:  Negative for congestion.   Respiratory:  Negative for cough, chest tightness and shortness of breath.   Gastrointestinal:  Positive for abdominal  pain. Negative for nausea and vomiting.  Genitourinary:  Negative for dysuria, hematuria, scrotal swelling, testicular pain and urgency.  Musculoskeletal:  Negative for arthralgias and myalgias.   all other systems are negative except as noted in the HPI and PMH.   Physical Exam Updated Vital Signs BP 123/71 (BP Location: Right Arm)   Pulse 80   Temp 98 F (36.7 C) (Oral)   Resp 18   Ht 6\' 1"  (1.854 m)   Wt 104.3 kg   SpO2 100%   BMI 30.34 kg/m   Physical Exam Vitals and nursing note reviewed.  Constitutional:      General: He is not in acute distress.    Appearance: He is well-developed.  HENT:     Head: Normocephalic and atraumatic.     Mouth/Throat:     Pharynx: No oropharyngeal exudate.  Eyes:     Conjunctiva/sclera: Conjunctivae normal.     Pupils: Pupils are equal, round, and reactive to light.  Neck:     Comments: No meningismus. Cardiovascular:     Rate and Rhythm: Normal rate and regular rhythm.     Heart sounds: Normal heart sounds. No murmur heard. Pulmonary:     Effort: Pulmonary effort is normal. No respiratory distress.     Breath sounds: Normal breath sounds.  Abdominal:     Palpations: Abdomen is soft.  Tenderness: There is no abdominal tenderness. There is no guarding or rebound.     Comments: Testicles nontender.  Slight bulge to right inguinal canal without obvious hernia.  Abdomen soft and nontender  Musculoskeletal:        General: No tenderness. Normal range of motion.     Cervical back: Normal range of motion and neck supple.  Skin:    General: Skin is warm.  Neurological:     Mental Status: He is alert and oriented to person, place, and time.     Cranial Nerves: No cranial nerve deficit.     Motor: No abnormal muscle tone.     Coordination: Coordination normal.     Comments: No ataxia on finger to nose bilaterally. No pronator drift. 5/5 strength throughout. CN 2-12 intact.Equal grip strength. Sensation intact.   Psychiatric:         Behavior: Behavior normal.    ED Results / Procedures / Treatments   Labs (all labs ordered are listed, but only abnormal results are displayed) Labs Reviewed  URINALYSIS, ROUTINE W REFLEX MICROSCOPIC - Abnormal; Notable for the following components:      Result Value   Color, Urine AMBER (*)    Ketones, ur 15 (*)    Protein, ur 30 (*)    Leukocytes,Ua SMALL (*)    All other components within normal limits  CBC WITH DIFFERENTIAL/PLATELET - Abnormal; Notable for the following components:   RBC 6.12 (*)    MCV 72.9 (*)    MCH 23.4 (*)    All other components within normal limits  COMPREHENSIVE METABOLIC PANEL - Abnormal; Notable for the following components:   Glucose, Bld 107 (*)    All other components within normal limits  URINALYSIS, MICROSCOPIC (REFLEX) - Abnormal; Notable for the following components:   Bacteria, UA FEW (*)    All other components within normal limits    EKG None  Radiology CT ABDOMEN PELVIS W CONTRAST  Result Date: 05/24/2021 CLINICAL DATA:  Abdominal pain. EXAM: CT ABDOMEN AND PELVIS WITH CONTRAST TECHNIQUE: Multidetector CT imaging of the abdomen and pelvis was performed using the standard protocol following bolus administration of intravenous contrast. CONTRAST:  OMNIPAQUE IOHEXOL 350 MG/ML SOLN COMPARISON:  None. FINDINGS: Lower chest: Unremarkable Hepatobiliary: No suspicious focal abnormality within the liver parenchyma. Gallbladder is nondistended. No intrahepatic or extrahepatic biliary dilation. Pancreas: No focal mass lesion. No dilatation of the main duct. No intraparenchymal cyst. No peripancreatic edema. Spleen: No splenomegaly. No focal mass lesion. Adrenals/Urinary Tract: No adrenal nodule or mass. Kidneys unremarkable. No evidence for hydroureter. The urinary bladder appears normal for the degree of distention. Stomach/Bowel: Stomach is distended with food and fluid. Duodenum is normally positioned as is the ligament of Treitz. No small  bowel wall thickening. No small bowel dilatation. The terminal ileum is normal. The appendix is normal. No gross colonic mass. No colonic wall thickening. Vascular/Lymphatic: There is mild atherosclerotic calcification of the abdominal aorta without aneurysm. There is no gastrohepatic or hepatoduodenal ligament lymphadenopathy. No retroperitoneal or mesenteric lymphadenopathy. No pelvic sidewall lymphadenopathy. Reproductive: The prostate gland and seminal vesicles are unremarkable. Other: No intraperitoneal free fluid. Musculoskeletal: Small right groin hernia contains only fat. No edema or fluid in the hernia sac to suggest fat incarceration. No worrisome lytic or sclerotic osseous abnormality. IMPRESSION: 1. No acute findings in the abdomen or pelvis. 2. Small right groin hernia contains only fat. No edema or fluid in the hernia sac to suggest fat incarceration. Electronically Signed  By: Kennith Center M.D.   On: 05/24/2021 05:13    Procedures Procedures   Medications Ordered in ED Medications - No data to display  ED Course  I have reviewed the triage vital signs and the nursing notes.  Pertinent labs & imaging results that were available during my care of the patient were reviewed by me and considered in my medical decision making (see chart for details).    MDM Rules/Calculators/A&P                          Inguinal pain with concern for hernia.  No evidence of obstruction or incarceration  Labs reassuring.  CT shows fat containing hernia, no bowel involvement. No evidence of incarceration or strangulation.  Lifting precautions, NSAIDs, general surgery followup for assessment.  Return precautions discussed  Final Clinical Impression(s) / ED Diagnoses Final diagnoses:  Right inguinal hernia    Rx / DC Orders ED Discharge Orders     None        Sherol Sabas, Jeannett Senior, MD 05/24/21 [phone]

## 2021-08-13 ENCOUNTER — Other Ambulatory Visit: Payer: Self-pay | Admitting: General Surgery

## 2022-02-22 IMAGING — CT CT ABD-PELV W/ CM
2 of 5 series · 16 of 46 positions shown, 18 images · IV contrast (Omnipaque)
Comparison: None.

CLINICAL DATA: Abdominal pain.

EXAM:
CT ABDOMEN AND PELVIS WITH CONTRAST
TECHNIQUE: Multidetector CT imaging of the abdomen and pelvis was performed
using the standard protocol following bolus administration of
intravenous contrast.
CONTRAST:  100mL OMNIPAQUE IOHEXOL 350 MG/ML SOLN

[Series 2: axial st · axial · 0.96mm/px · z∈[+690,+1185]mm · 13 of 111 slices shown, 15 images]
[im 6/111  soft-tissue]
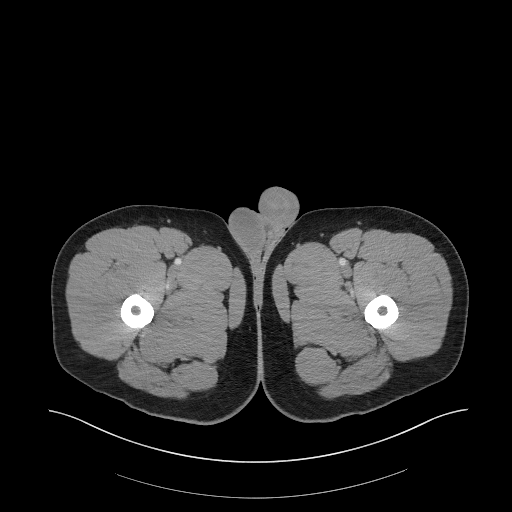
[im 6/111  bone]
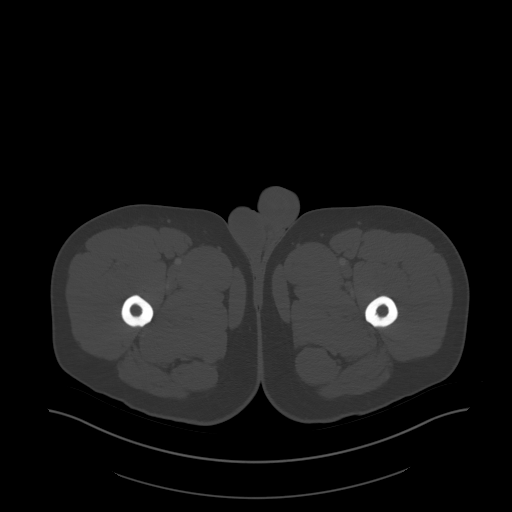
[im 18/111  soft-tissue]
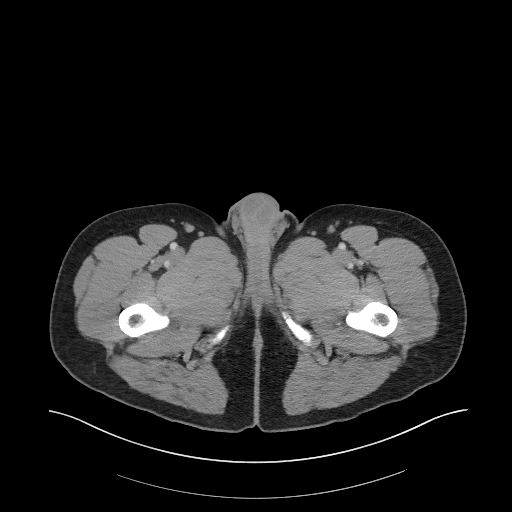
[im 24/111  soft-tissue]
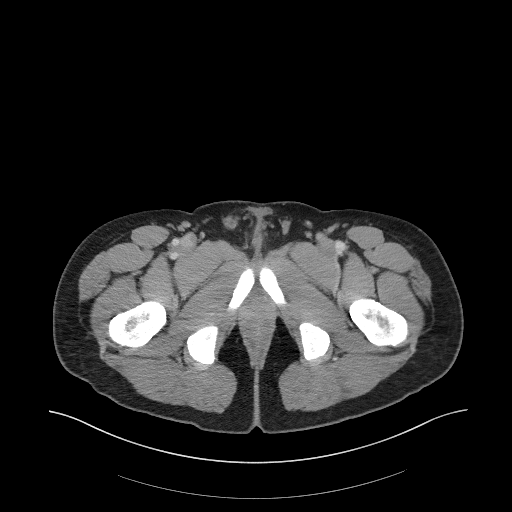
[im 29/111  soft-tissue]
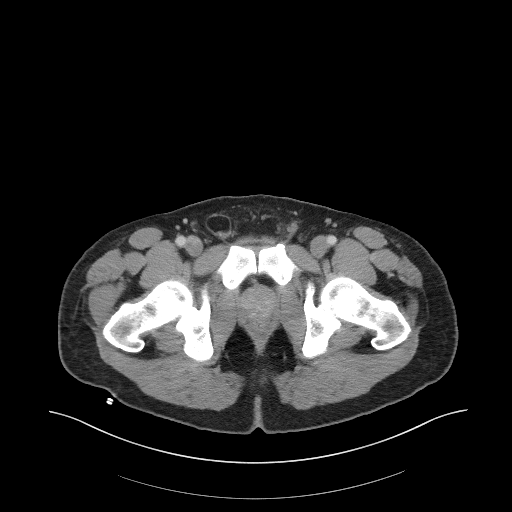
[im 41/111  soft-tissue]
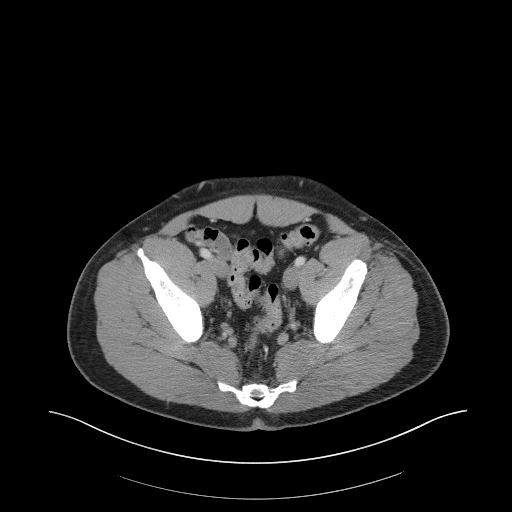
[im 47/111  soft-tissue]
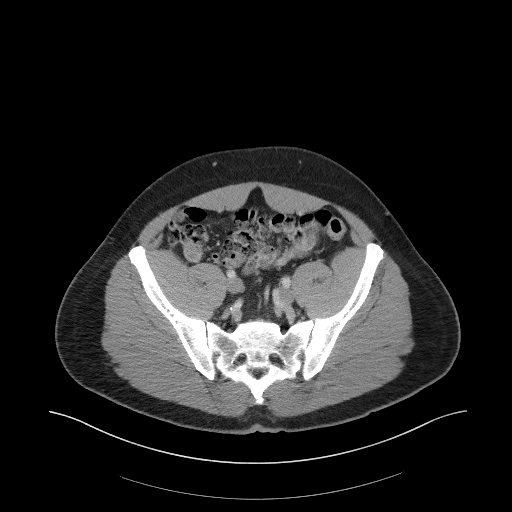
[im 58/111  soft-tissue]
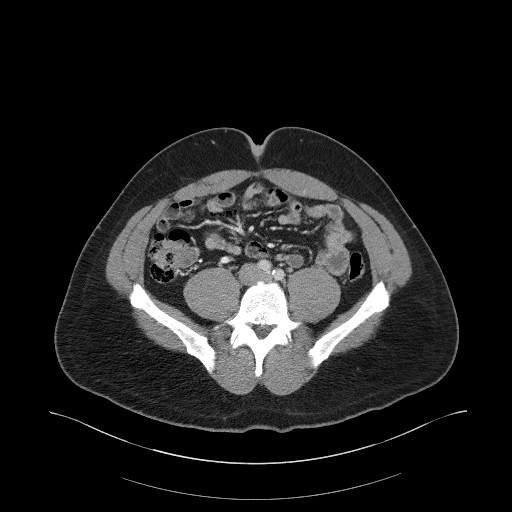
[im 64/111  soft-tissue]
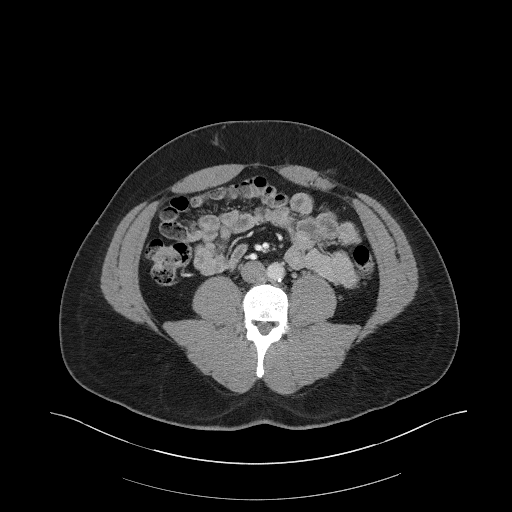
[im 70/111  soft-tissue]
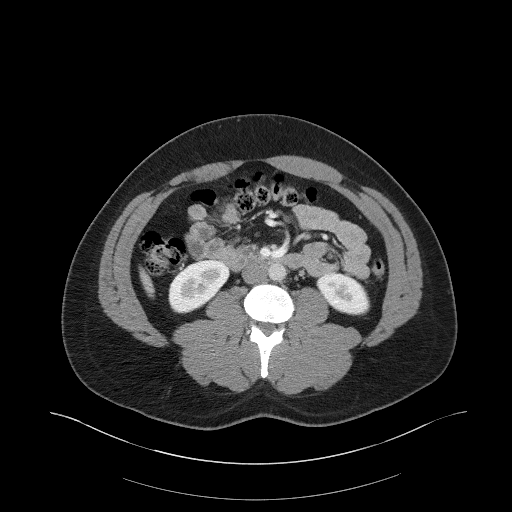
[im 70/111  bone]
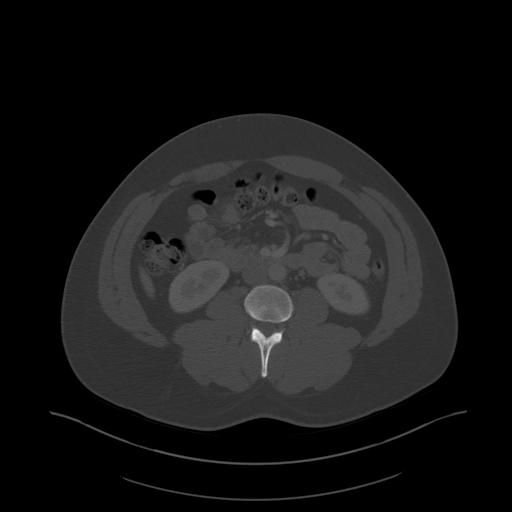
[im 82/111  soft-tissue]
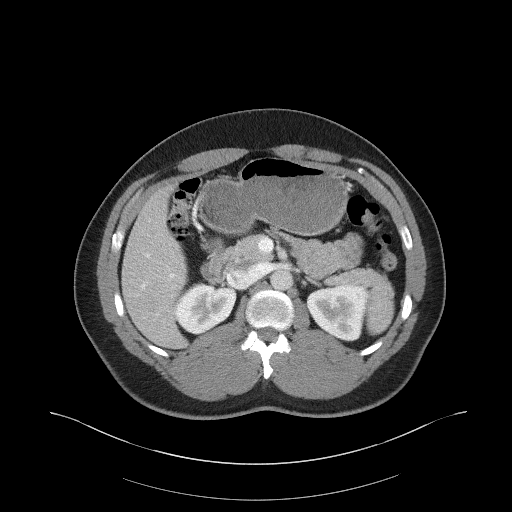
[im 87/111  soft-tissue]
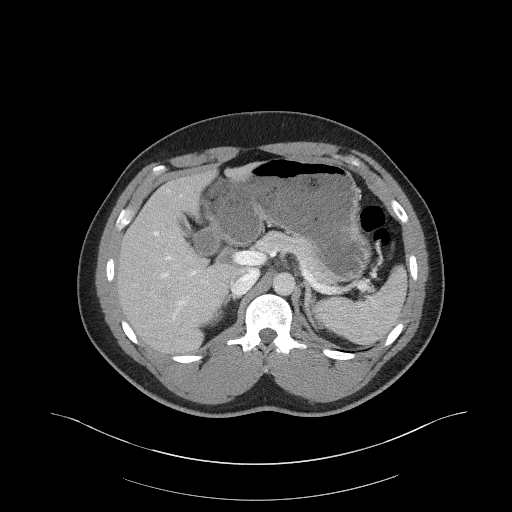
[im 93/111  soft-tissue]
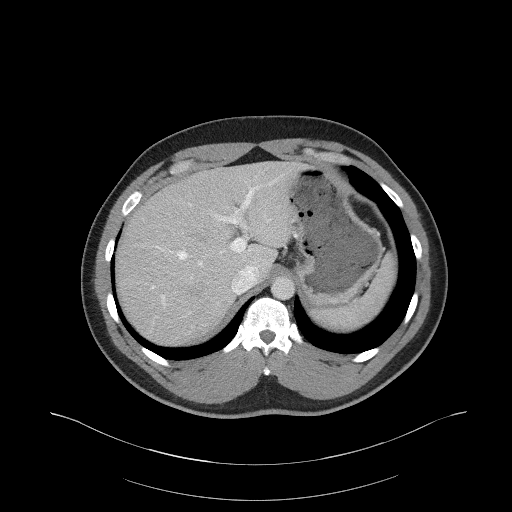
[im 105/111  soft-tissue]
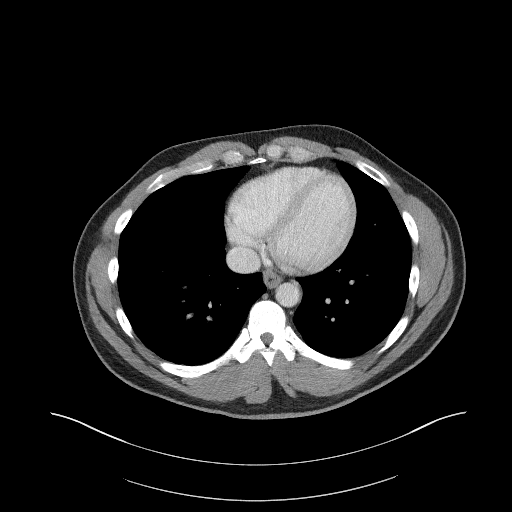

[Series 5: coronal st · coronal · 0.82mm/px · 3 of 113 slices shown]
[im 38/113  soft-tissue]
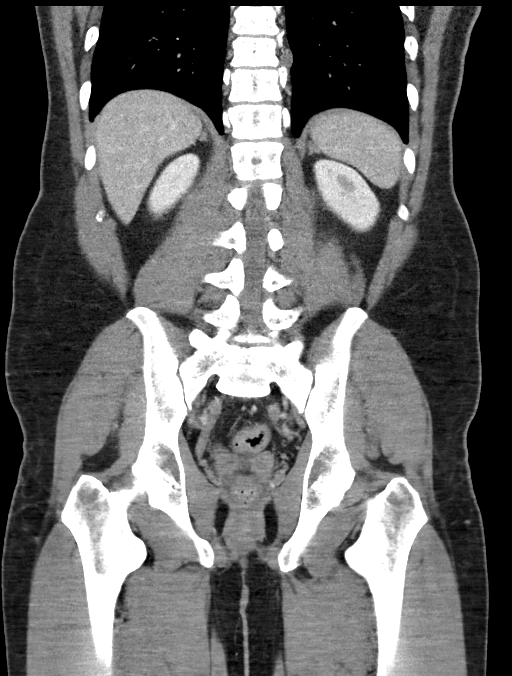
[im 50/113  soft-tissue]
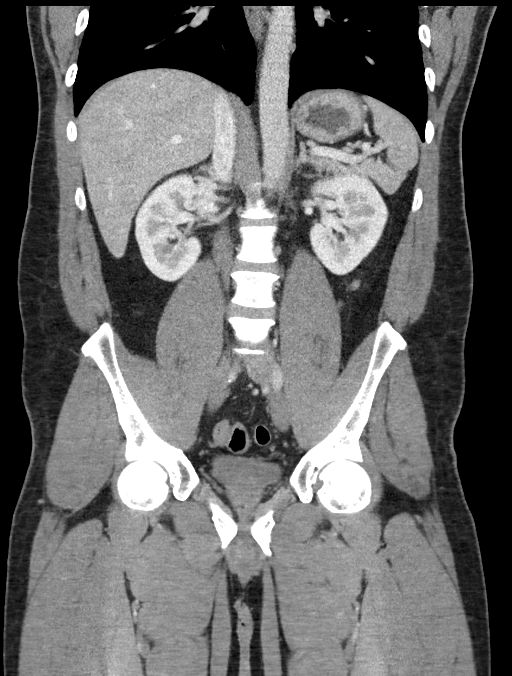
[im 63/113  soft-tissue]
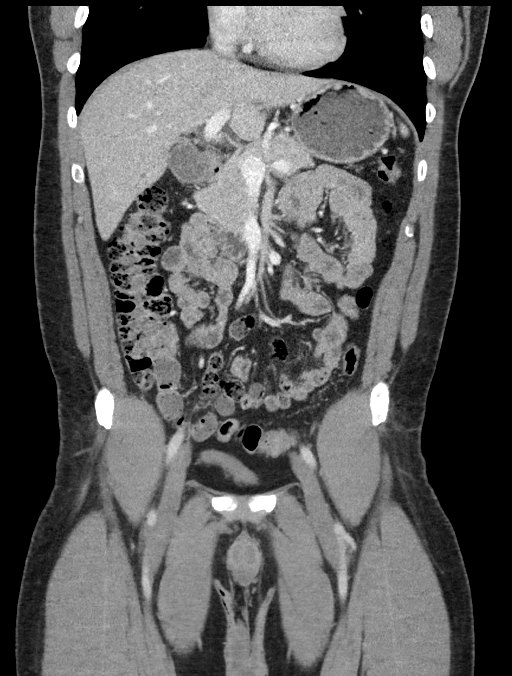

[16 of 46 positions shown; findings below may reference images not displayed]

FINDINGS: Lower chest: Unremarkable

Hepatobiliary: No suspicious focal abnormality within the liver
parenchyma. Gallbladder is nondistended. No intrahepatic or
extrahepatic biliary dilation.

Pancreas: No focal mass lesion. No dilatation of the main duct. No
intraparenchymal cyst. No peripancreatic edema.

Spleen: No splenomegaly. No focal mass lesion.

Adrenals/Urinary Tract: No adrenal nodule or mass. Kidneys
unremarkable. No evidence for hydroureter. The urinary bladder
appears normal for the degree of distention.

Stomach/Bowel: Stomach is distended with food and fluid. Duodenum is
normally positioned as is the ligament of Treitz. No small bowel
wall thickening. No small bowel dilatation. The terminal ileum is
normal. The appendix is normal. No gross colonic mass. No colonic
wall thickening.

Vascular/Lymphatic: There is mild atherosclerotic calcification of
the abdominal aorta without aneurysm. There is no gastrohepatic or
hepatoduodenal ligament lymphadenopathy. No retroperitoneal or
mesenteric lymphadenopathy. No pelvic sidewall lymphadenopathy.

Reproductive: The prostate gland and seminal vesicles are
unremarkable.

Other: No intraperitoneal free fluid.

Musculoskeletal: Small right groin hernia contains only fat. No
edema or fluid in the hernia sac to suggest fat incarceration. No
worrisome lytic or sclerotic osseous abnormality.
IMPRESSION: 1. No acute findings in the abdomen or pelvis.
2. Small right groin hernia contains only fat. No edema or fluid in
the hernia sac to suggest fat incarceration.

## 2022-03-13 ENCOUNTER — Emergency Department (HOSPITAL_BASED_OUTPATIENT_CLINIC_OR_DEPARTMENT_OTHER)
Admission: EM | Admit: 2022-03-13 | Discharge: 2022-03-13 | Disposition: A | Discharge status: Home / Self Care | Payer: BC Managed Care – PPO | Attending: Emergency Medicine | Admitting: Emergency Medicine

## 2022-03-13 ENCOUNTER — Other Ambulatory Visit: Payer: Self-pay

## 2022-03-13 ENCOUNTER — Encounter (HOSPITAL_BASED_OUTPATIENT_CLINIC_OR_DEPARTMENT_OTHER): Payer: Self-pay | Admitting: Emergency Medicine

## 2022-03-13 ENCOUNTER — Emergency Department (HOSPITAL_BASED_OUTPATIENT_CLINIC_OR_DEPARTMENT_OTHER): Payer: BC Managed Care – PPO

## 2022-03-13 DIAGNOSIS — R0789 Other chest pain: Secondary | ICD-10-CM | POA: Diagnosis present

## 2022-03-13 DIAGNOSIS — L02213 Cutaneous abscess of chest wall: Secondary | ICD-10-CM | POA: Insufficient documentation

## 2022-03-13 DIAGNOSIS — M7989 Other specified soft tissue disorders: Secondary | ICD-10-CM

## 2022-03-13 NOTE — ED Provider Notes (Signed)
[facility] EMERGENCY DEPARTMENT Provider Note   CSN: [id number] Arrival date & time: 03/13/22  6222     History  No chief complaint on file.   Danny Robinson is a 46 y.o. male.  46 yo M with a cc of R chest wall pain. Going on for months. Started with cough, congestion.  Facial pain, discomfort when he takes a big deep breath.  This is actually improved a bit.  The other day though he noticed that there was a soft tissue change to the right side of his chest.  This was on for prominent when he took a big deep breath.  He felt like it was more prominent this evening and so he decided to be evaluated.        Home Medications Prior to Admission medications   Medication Sig Start Date End Date Taking? Authorizing Provider  naproxen (NAPROSYN) 500 MG tablet Take 1 tablet (500 mg total) by mouth 2 (two) times daily as needed. 05/24/21   Rancour, Jeannett Senior, MD  dicyclomine (BENTYL) 20 MG tablet Take 1 tablet (20 mg total) by mouth 2 (two) times daily as needed for spasms. 10/21/16 10/27/19  Loren Racer, MD      Allergies    Patient has no known allergies.    Review of Systems   Review of Systems  Physical Exam Updated Vital Signs BP (!) 139/99 (BP Location: Right Arm)   Pulse 92   Temp 98.1 F (36.7 C) (Oral)   Resp 20   Ht 6\' 1"  (1.854 m)   Wt 97.5 kg   SpO2 99%   BMI 28.37 kg/m  Physical Exam Vitals and nursing note reviewed.  Constitutional:      Appearance: He is well-developed.  HENT:     Head: Normocephalic and atraumatic.  Eyes:     Pupils: Pupils are equal, round, and reactive to light.  Neck:     Vascular: No JVD.  Cardiovascular:     Rate and Rhythm: Normal rate and regular rhythm.     Heart sounds: No murmur heard.    No friction rub. No gallop.  Pulmonary:     Effort: No respiratory distress.     Breath sounds: No wheezing.  Chest:       Comments: Area of concern for patient, increased soft tissue density with cord, mobile, non  tender, no erythema, no warmth Abdominal:     General: There is no distension.     Tenderness: There is no abdominal tenderness. There is no guarding or rebound.  Musculoskeletal:        General: Normal range of motion.     Cervical back: Normal range of motion and neck supple.  Skin:    Coloration: Skin is not pale.     Findings: No rash.  Neurological:     Mental Status: He is alert and oriented to person, place, and time.  Psychiatric:        Behavior: Behavior normal.     ED Results / Procedures / Treatments   Labs (all labs ordered are listed, but only abnormal results are displayed) Labs Reviewed - No data to display  EKG EKG Interpretation  Date/Time:  Friday March 13 2022 04:36:06 EDT Ventricular Rate:  79 PR Interval:  152 QRS Duration: 92 QT Interval:  393 QTC Calculation: 451 R Axis:   66 Text Interpretation: Sinus rhythm No old tracing to compare Confirmed by 11-05-2004 [phone]) on 03/13/2022 4:48:53 AM  Radiology DG Chest [facility]  1 View  Result Date: 03/13/2022 CLINICAL DATA:  Chest pain EXAM: PORTABLE CHEST 1 VIEW COMPARISON:  None Available. FINDINGS: The heart size and mediastinal contours are within normal limits. Both lungs are clear. The visualized skeletal structures are unremarkable. IMPRESSION: No active disease. Electronically Signed   By: Signa Kell M.D.   On: 03/13/2022 05:17    Procedures Procedures    Medications Ordered in ED Medications - No data to display  ED Course/ Medical Decision Making/ A&P                           Medical Decision Making Amount and/or Complexity of Data Reviewed Radiology: ordered.   46 yo M with a cc of R chest wall pain. Going on for months. Started with cough, congestion.  Facial pain, discomfort when he takes a big deep breath.  This is actually improved a bit.  The other day though he noticed that there was a soft tissue change to the right side of his chest.  This was on for prominent when he took a big  deep breath.  He felt like it was more prominent this evening and so he decided to be evaluated.  Patient is well-appearing and nontoxic.  I palpated the area and seems to be soft tissue, could be something that he has had his whole life I do feel like I appreciate something similar on the other side.  We will obtain a screening chest x-ray.  With prolonged length of symptoms seems unlikely to be ACS or pulmonary embolism.  We will have him follow-up with his family doctor.  EKG independently interpreted by me without ischemia.  Chest x-ray independently interpreted by me without focal infiltrate or pneumothorax.  5:22 AM:  I have discussed the diagnosis/risks/treatment options with the patient.  Evaluation and diagnostic testing in the emergency department does not suggest an emergent condition requiring admission or immediate intervention beyond what has been performed at this time.  They will follow up with  PCP. We also discussed returning to the ED immediately if new or worsening sx occur. We discussed the sx which are most concerning (e.g., sudden worsening pain, fever, inability to tolerate by mouth) that necessitate immediate return. Medications administered to the patient during their visit and any new prescriptions provided to the patient are listed below.  Medications given during this visit Medications - No data to display   The patient appears reasonably screen and/or stabilized for discharge and I doubt any other medical condition or other [facility] requiring further screening, evaluation, or treatment in the ED at this time prior to discharge.          Final Clinical Impression(s) / ED Diagnoses Final diagnoses:  Soft tissue mass    Rx / DC Orders ED Discharge Orders     None         Melene Plan, DO 03/13/22 0522

## 2022-03-13 NOTE — ED Triage Notes (Signed)
Pt states has mold in apt for the last 3 years and his "breathing ain't right" also has veins on sticking out right flank, is new Sx

## 2022-03-13 NOTE — Discharge Instructions (Signed)
Your chest x-ray did not show any signs of pneumonia or other concerning finding.  Please follow-up with your family doctor for your symptoms.  Please return to the Emergency Department for worsening difficulty breathing fever inability eat or drink.  If you do not have a family doctor I have given you information for the family doctors offices upstairs there is also a hotline number in this paperwork you can try and call for help finding a family doctor if you wish.

## 2022-09-23 ENCOUNTER — Other Ambulatory Visit: Payer: Self-pay

## 2022-09-23 ENCOUNTER — Emergency Department (HOSPITAL_BASED_OUTPATIENT_CLINIC_OR_DEPARTMENT_OTHER)
Admission: EM | Admit: 2022-09-23 | Discharge: 2022-09-23 | Disposition: A | Discharge status: Home / Self Care | Payer: Commercial Managed Care - PPO | Attending: Emergency Medicine | Admitting: Emergency Medicine

## 2022-09-23 ENCOUNTER — Encounter (HOSPITAL_BASED_OUTPATIENT_CLINIC_OR_DEPARTMENT_OTHER): Payer: Self-pay | Admitting: Emergency Medicine

## 2022-09-23 ENCOUNTER — Emergency Department (HOSPITAL_BASED_OUTPATIENT_CLINIC_OR_DEPARTMENT_OTHER): Payer: Commercial Managed Care - PPO

## 2022-09-23 DIAGNOSIS — J029 Acute pharyngitis, unspecified: Secondary | ICD-10-CM | POA: Diagnosis not present

## 2022-09-23 DIAGNOSIS — Z1152 Encounter for screening for COVID-19: Secondary | ICD-10-CM | POA: Insufficient documentation

## 2022-09-23 DIAGNOSIS — R42 Dizziness and giddiness: Secondary | ICD-10-CM | POA: Diagnosis not present

## 2022-09-23 LAB — BASIC METABOLIC PANEL
Anion gap: 7 (ref 5–15)
BUN: 9 mg/dL (ref 6–20)
CO2: 25 mmol/L (ref 22–32)
Calcium: 8.7 mg/dL — ABNORMAL LOW (ref 8.9–10.3)
Chloride: 101 mmol/L (ref 98–111)
Creatinine, Ser: 1.06 mg/dL (ref 0.61–1.24)
GFR, Estimated: >60 mL/min (ref >60)
Glucose, Bld: 106 mg/dL — ABNORMAL HIGH (ref 70–99)
Potassium: 3.9 mmol/L (ref 3.5–5.1)
Sodium: 133 mmol/L — ABNORMAL LOW (ref 135–145)

## 2022-09-23 LAB — RESP PANEL BY RT-PCR (RSV, FLU A&B, COVID)  RVPGX2
Influenza A by PCR: NEGATIVE
Influenza B by PCR: NEGATIVE
Resp Syncytial Virus by PCR: NEGATIVE
SARS Coronavirus 2 by RT PCR: NEGATIVE

## 2022-09-23 LAB — CBC WITH DIFFERENTIAL/PLATELET
Abs Immature Granulocytes: 0.05 10*3/uL (ref 0.00–0.07)
Basophils Absolute: 0 10*3/uL (ref 0.0–0.1)
Basophils Relative: 0 %
Eosinophils Absolute: 0.1 10*3/uL (ref 0.0–0.5)
Eosinophils Relative: 1 %
HCT: 44.2 % (ref 39.0–52.0)
Hemoglobin: 14 g/dL (ref 13.0–17.0)
Immature Granulocytes: 1 %
Lymphocytes Relative: 11 %
Lymphs Abs: 0.7 10*3/uL (ref 0.7–4.0)
MCH: 23 pg — ABNORMAL LOW (ref 26.0–34.0)
MCHC: 31.7 g/dL (ref 30.0–36.0)
MCV: 72.7 fL — ABNORMAL LOW (ref 80.0–100.0)
Monocytes Absolute: 0.6 10*3/uL (ref 0.1–1.0)
Monocytes Relative: 9 %
Neutro Abs: 5.4 10*3/uL (ref 1.7–7.7)
Neutrophils Relative %: 78 %
Platelets: 229 10*3/uL (ref 150–400)
RBC: 6.08 MIL/uL — ABNORMAL HIGH (ref 4.22–5.81)
RDW: 13.9 % (ref 11.5–15.5)
WBC: 6.9 10*3/uL (ref 4.0–10.5)
nRBC: 0 % (ref 0.0–0.2)

## 2022-09-23 LAB — GROUP A STREP BY PCR: Group A Strep by PCR: NOT DETECTED

## 2022-09-23 LAB — CBG MONITORING, ED: Glucose-Capillary: 105 mg/dL — ABNORMAL HIGH (ref 70–99)

## 2022-09-23 MED ORDER — SODIUM CHLORIDE 0.9 % IV BOLUS
1000.0000 mL | Freq: Once | INTRAVENOUS | Status: AC
  Administered 2022-09-23: 1000 mL via INTRAVENOUS

## 2022-09-23 MED ORDER — DEXAMETHASONE SODIUM PHOSPHATE 10 MG/ML IJ SOLN
10.0000 mg | Freq: Once | INTRAMUSCULAR | Status: AC
  Administered 2022-09-23: 10 mg via INTRAMUSCULAR
  Filled 2022-09-23: qty 1

## 2022-09-23 MED ORDER — IBUPROFEN 200 MG PO TABS
600.0000 mg | ORAL_TABLET | Freq: Once | ORAL | Status: AC
  Administered 2022-09-23: 600 mg via ORAL
  Filled 2022-09-23: qty 1

## 2022-09-23 NOTE — Discharge Instructions (Signed)
It was a pleasure taking care of you today!   Your labs didn't show any emergent findings tonight. Your CT scan didn't show any concerns for emergent findings. Your COVID, Flu, RSV, Strep swab was negative tonight.  Ensure to maintain fluid intake with water, tea, broth, soup, Pedialyte, Gatorade.Marland Kitchen  You will be given information for ENT specialist, call and set up a follow-up appointment regarding today's ED visit.  You may return to the emergency department if you are experiencing increasing/worsening symptoms.

## 2022-09-23 NOTE — ED Provider Notes (Signed)
[facility] EMERGENCY DEPARTMENT Provider Note   CSN: [id number] Arrival date & time: 09/23/22  1612     History  Chief Complaint  Patient presents with   Sore Throat   Dizziness    Danny Robinson is a 47 y.o. male who presents to the ED with concerns for sore throat onset 3 months. Notes that this has been occurring intermittently. Has pain with drinking orange juice and painful swallowing. Has not been evaluated for this. Notes associated intermittent nausea for the past 3 months, none currently. Notes an episode of dizziness, more so lightheaded. Denies room spinning sensation. Denies history of vertigo. Doesn't have a PCP. Has been using hydrogen peroxide to aid with his symptoms. Denies trouble swallowing, vomiting, nasal congestion, cough. Pt denies smoking cigarettes, but notes that he just stopped smoking marijuana, but he uses [place] wrapping paper for his marijuana.   The history is provided by the patient. No language interpreter was used.       Home Medications Prior to Admission medications   Medication Sig Start Date End Date Taking? Authorizing Provider  naproxen (NAPROSYN) 500 MG tablet Take 1 tablet (500 mg total) by mouth 2 (two) times daily as needed. 05/24/21   Rancour, Jeannett Senior, MD  dicyclomine (BENTYL) 20 MG tablet Take 1 tablet (20 mg total) by mouth 2 (two) times daily as needed for spasms. 10/21/16 10/27/19  Loren Racer, MD      Allergies    Patient has no known allergies.    Review of Systems   Review of Systems  Constitutional:  Negative for fever.  HENT:  Positive for rhinorrhea. Negative for congestion.   Respiratory:  Negative for cough.   Neurological:  Positive for dizziness.  All other systems reviewed and are negative.   Physical Exam Updated Vital Signs BP 124/85   Pulse 84   Temp 99.2 F (37.3 C) (Oral)   Resp 20   Ht 6\' 1"  (1.854 m)   Wt 95.3 kg   SpO2 100%   BMI 27.71 kg/m  Physical Exam Vitals and nursing note  reviewed.  Constitutional:      General: He is not in acute distress.    Appearance: He is not diaphoretic.  HENT:     Head: Normocephalic and atraumatic.     Mouth/Throat:     Mouth: Mucous membranes are moist.     Pharynx: Oropharynx is clear. Uvula midline. No oropharyngeal exudate, posterior oropharyngeal erythema or uvula swelling.     Tonsils: No tonsillar exudate.     Comments: 0.25 cm laceration, non-gaping noted to left lateral tongue, no bleeding noted. No TTP noted to the area. Area of white to tongue. Uvula midline without swelling. No posterior pharyngeal erythema or tonsillar exudate noted. Patent airway. Pt able to speak in clear complete sentences. Tolerating oral secretions. Eyes:     General: No scleral icterus.    Conjunctiva/sclera: Conjunctivae normal.  Cardiovascular:     Rate and Rhythm: Normal rate and regular rhythm.     Pulses: Normal pulses.     Heart sounds: Normal heart sounds.  Pulmonary:     Effort: Pulmonary effort is normal. No respiratory distress.     Breath sounds: Normal breath sounds. No wheezing.  Abdominal:     General: Bowel sounds are normal.     Palpations: Abdomen is soft. There is no mass.     Tenderness: There is no abdominal tenderness. There is no guarding or rebound.  Musculoskeletal:  General: Normal range of motion.     Cervical back: Normal range of motion and neck supple.  Skin:    General: Skin is warm and dry.  Neurological:     Mental Status: He is alert.  Psychiatric:        Behavior: Behavior normal.     ED Results / Procedures / Treatments   Labs (all labs ordered are listed, but only abnormal results are displayed) Labs Reviewed  BASIC METABOLIC PANEL - Abnormal; Notable for the following components:      Result Value   Sodium 133 (*)    Glucose, Bld 106 (*)    Calcium 8.7 (*)    All other components within normal limits  CBC WITH DIFFERENTIAL/PLATELET - Abnormal; Notable for the following components:    RBC 6.08 (*)    MCV 72.7 (*)    MCH 23.0 (*)    All other components within normal limits  CBG MONITORING, ED - Abnormal; Notable for the following components:   Glucose-Capillary 105 (*)    All other components within normal limits  RESP PANEL BY RT-PCR (RSV, FLU A&B, COVID)  RVPGX2  GROUP A STREP BY PCR    EKG EKG Interpretation  Date/Time:  Wednesday September 23 2022 16:36:11 EST Ventricular Rate:  83 PR Interval:  138 QRS Duration: 87 QT Interval:  357 QTC Calculation: 420 R Axis:   75 Text Interpretation: Sinus rhythm Atrial premature complex ST elev, probable normal early repol pattern Baseline wander in lead(s) V3 No significant change since last tracing Confirmed by Wandra Arthurs [phone]) on 09/23/2022 7:14:54 PM  Radiology CT Head Wo Contrast  Result Date: 09/23/2022 CLINICAL DATA:  Dizziness EXAM: CT HEAD WITHOUT CONTRAST TECHNIQUE: Contiguous axial images were obtained from the base of the skull through the vertex without intravenous contrast. RADIATION DOSE REDUCTION: This exam was performed according to the departmental dose-optimization program which includes automated exposure control, adjustment of the mA and/or kV according to patient size and/or use of iterative reconstruction technique. COMPARISON:  None Available. FINDINGS: Brain: No evidence of acute infarction, hemorrhage, hydrocephalus, extra-axial collection or mass lesion/mass effect. Vascular: No hyperdense vessel or unexpected calcification. Skull: Normal. Negative for fracture or focal lesion. Sinuses/Orbits: Paranasal sinuses and mastoid air cells are clear. Other: Congenital nonunion of the posterior arch of C1. IMPRESSION: No acute intracranial abnormalities. Electronically Signed   By: Lucienne Capers M.D.   On: 09/23/2022 18:47    Procedures Procedures    Medications Ordered in ED Medications  dexamethasone (DECADRON) injection 10 mg (10 mg Intramuscular Given 09/23/22 1925)  ibuprofen (ADVIL) tablet  600 mg (600 mg Oral Given 09/23/22 1925)  sodium chloride 0.9 % bolus 1,000 mL (0 mLs Intravenous Stopped 09/23/22 2047)    ED Course/ Medical Decision Making/ A&P Clinical Course as of 09/23/22 2231  Wed Sep 23, 2022  2025 Patient reevaluated and noted improvement of symptoms. Discussed with patient discharge treatment. Answered all available questions. Patient appears safe for discharge at this time.  [SB]    Clinical Course User Index [SB] Darrielle Pflieger A, PA-C                             Medical Decision Making Amount and/or Complexity of Data Reviewed Labs: ordered. Radiology: ordered.  Risk Prescription drug management.   Pt presents with concerns for sore throat onset 3 months.  Has not been evaluated for the symptoms.  Also notes that  he woke up this morning and felt lightheaded.  Denies room spinning sensation.  No history of vertigo.  Patient afebrile, not tachycardic or hypoxic.  On exam patient without focal neurologic deficits.  Negative pronator drift.  Able to ambulate without assistance difficulty.  Small cut noted to left lateral tongue, no bleeding noted.  No tenderness to palpation noted to the area.  White area noted to left lateral tongue without an erythematous base.  Area is not able to be scraped.  Uvula midline without swelling.  No posterior pharyngeal erythema or tonsillar exudate noted.  Patent airway.  Patient able to speak in clear complete sentences.  Tolerating oral secretions. No acute cardiovascular, respiratory exam findings. Differential diagnosis includes intracranial abnormality, vertigo, viral pharyngitis, strep pharyngitis, thrush, electrolyte abnormality, arrhythmia, hypoglycemia.   Labs:  I ordered, and personally interpreted labs.  The pertinent results include:   CBC without leukocytosis. CBG at 105. BMP with sodium decreased slightly at 133 otherwise unremarkable. Negative COVID, flu, RSV, strep.  Imaging: I ordered imaging studies including  CT head without I independently visualized and interpreted imaging which showed:  No acute intracranial abnormalities.   I agree with the radiologist interpretation  Medications:  I ordered medication including IVF, Decadron, ibuprofen for symptom management Reevaluation of the patient after these medicines and interventions, I reevaluated the patient and found that they have improved I have reviewed the patients home medicines and have made adjustments as needed   Disposition: Presentation suspicious for viral pharyngitis.  Doubt strep pharyngitis, COVID, flu, RSV.  Doubt concerns for hypoglycemia, arrhythmia, anemia, electrolyte abnormality, intracranial abnormality. After consideration of the diagnostic results and the patients response to treatment, I feel that the patient would benefit from Discharge home.  Patient provided with information for ENT specialist to follow-up regarding today's ED visit.  Supportive care measures and strict return precautions discussed with patient at bedside. Pt acknowledges and verbalizes understanding. Pt appears safe for discharge. Follow up as indicated in discharge paperwork.    This chart was dictated using voice recognition software, Dragon. Despite the best efforts of this provider to proofread and correct errors, errors may still occur which can change documentation meaning.  Final Clinical Impression(s) / ED Diagnoses Final diagnoses:  Viral pharyngitis  Dizziness    Rx / DC Orders ED Discharge Orders     None         Diamonte Stavely A, PA-C 09/23/22 2233    Drenda Freeze, MD 09/23/22 2257

## 2022-09-23 NOTE — ED Triage Notes (Signed)
Pt c/o sore throat intermittently x months; also has sore on tongue that is not healing; reports dizziness since this morning; does not increase with standing

## 2022-11-15 ENCOUNTER — Emergency Department (HOSPITAL_BASED_OUTPATIENT_CLINIC_OR_DEPARTMENT_OTHER)
Admission: EM | Admit: 2022-11-15 | Discharge: 2022-11-15 | Disposition: A | Discharge status: Home / Self Care | Payer: Commercial Managed Care - PPO | Attending: Emergency Medicine | Admitting: Emergency Medicine

## 2022-11-15 ENCOUNTER — Other Ambulatory Visit: Payer: Self-pay

## 2022-11-15 ENCOUNTER — Encounter (HOSPITAL_BASED_OUTPATIENT_CLINIC_OR_DEPARTMENT_OTHER): Payer: Self-pay | Admitting: Emergency Medicine

## 2022-11-15 DIAGNOSIS — R58 Hemorrhage, not elsewhere classified: Secondary | ICD-10-CM | POA: Diagnosis present

## 2022-11-15 DIAGNOSIS — Z9089 Acquired absence of other organs: Secondary | ICD-10-CM | POA: Insufficient documentation

## 2022-11-15 NOTE — ED Notes (Signed)
Last dose of prescribed Percocet 5-325 ~1700.

## 2022-11-15 NOTE — ED Provider Notes (Signed)
   [facility]  Provider Note  CSN: [id number] Arrival date & time: 11/15/22 0440  History Chief Complaint  Patient presents with   Post-op Problem    Danny Robinson is a 47 y.o. male who is 4 days s/p tonsillectomy (outpatient, Dr. Ilda Foil in [place]) reports he was sitting up watching TV a short time ago when he began to have a choking sensation, went outside to get some air and coughed up some bloody tissue/clot. He is feeling better now. Reports he has had some mild bleeding post-op but this was the first time he felt trouble breathing. He has been taking Motrin and Percocet for his pain with good pain control. No issues swallowing or breathing now.    Home Medications Prior to Admission medications   Medication Sig Start Date End Date Taking? Authorizing Provider  naproxen (NAPROSYN) 500 MG tablet Take 1 tablet (500 mg total) by mouth 2 (two) times daily as needed. 05/24/21   Rancour, Annie Main, MD  dicyclomine (BENTYL) 20 MG tablet Take 1 tablet (20 mg total) by mouth 2 (two) times daily as needed for spasms. 10/21/16 10/27/19  Julianne Rice, MD     Allergies    Patient has no known allergies.   Review of Systems   Review of Systems Please see HPI for pertinent positives and negatives  Physical Exam Pulse 100   Temp 98.3 F (36.8 C) (Oral)   Resp 20   Ht 6\' 1"  (1.854 m)   Wt 97.5 kg   SpO2 100%   BMI 28.37 kg/m   Physical Exam Vitals and nursing note reviewed.  HENT:     Head: Normocephalic.     Nose: Nose normal.     Mouth/Throat:     Comments: Mild amount of old blood in the post-surgical tonsil beds, no active bleeding; uvula is midline Eyes:     Extraocular Movements: Extraocular movements intact.  Pulmonary:     Effort: Pulmonary effort is normal.     Breath sounds: No stridor.  Musculoskeletal:        General: Normal range of motion.     Cervical back: Neck supple.  Skin:    Findings: No rash (on exposed  skin).  Neurological:     Mental Status: He is alert and oriented to person, place, and time.  Psychiatric:        Mood and Affect: Mood normal.     ED Results / Procedures / Treatments   EKG None  Procedures Procedures  Medications Ordered in the ED Medications - No data to display  Initial Impression and Plan  Patient here with some minor post-tonsillectomy bleeding vs eschar sloughing. At the time of my evaluation there is no active bleeding, no signs of airway compromise and able to swallow water without difficulty. Advised that post-op bleeding is common, especially in adults. Recommend he continue with supportive management and if his symptoms worsen to call EMS and/or go to [facility] where ENT is available to assess him.   ED Course       MDM Rules/Calculators/A&P Medical Decision Making Problems Addressed: Bleeding: acute illness or injury Status post tonsillectomy: acute illness or injury     Final Clinical Impression(s) / ED Diagnoses Final diagnoses:  Bleeding  Status post tonsillectomy    Rx / DC Orders ED Discharge Orders     None        Truddie Hidden, MD 11/15/22 [phone]

## 2022-11-15 NOTE — ED Triage Notes (Signed)
Pt had his tonsils removed 11/11/22. He states he is still having bleeding. He reports his throat feels like its "filling up" and he passed a large blood clot out of his mouth earlier. He reports that when this happened he had a hard time breathing, he states that this is improved now but still doesn't feel "normal". Denies other sx.

## 2023-10-05 NOTE — Progress Notes (Signed)
 Danny Robinson is here for a post offer-employment exam.  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  Please see scanned documents in media.  OSHA questionnaire reviewed, PFT and normal physical exam, see scanned documents in EPIC. Employer did not provide job description. There are no signs of cardiac or respiratory issues. I have educated employee on proper use and fitting of respiratory mask to prevent disease.  After reviewing OSHA questionnaire and PFT results along with normal physical exam he is cleared for respirator use for normal job duties.

## 2024-07-16 ENCOUNTER — Other Ambulatory Visit: Payer: Self-pay

## 2024-07-16 ENCOUNTER — Encounter (HOSPITAL_BASED_OUTPATIENT_CLINIC_OR_DEPARTMENT_OTHER): Payer: Self-pay | Admitting: Emergency Medicine

## 2024-07-16 ENCOUNTER — Emergency Department (HOSPITAL_BASED_OUTPATIENT_CLINIC_OR_DEPARTMENT_OTHER)
Admission: EM | Admit: 2024-07-16 | Discharge: 2024-07-16 | Disposition: A | Discharge status: Home / Self Care | Attending: Emergency Medicine | Admitting: Emergency Medicine

## 2024-07-16 DIAGNOSIS — H66001 Acute suppurative otitis media without spontaneous rupture of ear drum, right ear: Secondary | ICD-10-CM | POA: Diagnosis not present

## 2024-07-16 DIAGNOSIS — R220 Localized swelling, mass and lump, head: Secondary | ICD-10-CM | POA: Diagnosis present

## 2024-07-16 MED ORDER — AMOXICILLIN-POT CLAVULANATE 875-125 MG PO TABS
1.0000 | ORAL_TABLET | Freq: Once | ORAL | Status: AC
  Administered 2024-07-16: 1 via ORAL
  Filled 2024-07-16: qty 1

## 2024-07-16 MED ORDER — AMOXICILLIN-POT CLAVULANATE 875-125 MG PO TABS: 1.0000 | ORAL_TABLET | Freq: Two times a day (BID) | ORAL | 0 refills | Status: AC

## 2024-07-16 NOTE — ED Triage Notes (Signed)
 Pt with RT side facial swelling since Thurs; denies dental pain; c/o HA

## 2024-07-16 NOTE — Discharge Instructions (Signed)
 Follow up with your family doc in the office.  Please return for rapid worsening difficulty breathing or swallowing. Max OTC meds below.  Take 4 over the counter ibuprofen  tablets 3 times a day or 2 over-the-counter naproxen  tablets twice a day for pain. Also take tylenol  1000mg (2 extra strength) four times a day.

## 2024-07-16 NOTE — ED Provider Notes (Signed)
 [facility] Provider Note   CSN: [id number] Arrival date & time: 07/16/24  1536     Patient presents with: Facial Swelling   Danny Robinson is a 48 y.o. male.   48 yo M with a chief complaints of swelling to his face.  Going on for a couple days.  He denies pain in his mouth.  Denies cough or congestion.  He is having some headaches on that side.  Denies trauma to the area.  He says he has a lesion to his right temporal area that he thinks is from something maybe biting him.  He works outside.  Has ear pain off and on.  No fevers.        Prior to Admission medications   Medication Sig Start Date End Date Taking? Authorizing Provider  amoxicillin-clavulanate (AUGMENTIN) 875-125 MG tablet Take 1 tablet by mouth every 12 (twelve) hours. 07/16/24  Yes Emil Share, DO  naproxen  (NAPROSYN ) 500 MG tablet Take 1 tablet (500 mg total) by mouth 2 (two) times daily as needed. 05/24/21   Rancour, Garnette, MD  dicyclomine  (BENTYL ) 20 MG tablet Take 1 tablet (20 mg total) by mouth 2 (two) times daily as needed for spasms. 10/21/16 10/27/19  Carlyle Lenis, MD    Allergies: Patient has no known allergies.    Review of Systems  Updated Vital Signs BP 131/88 (BP Location: Right Arm)   Pulse 96   Temp 98 F (36.7 C) (Oral)   Resp 18   Ht 6' 1 (1.854 m)   Wt 99.8 kg   SpO2 100%   BMI 29.03 kg/m   Physical Exam Vitals and nursing note reviewed.  Constitutional:      Appearance: He is well-developed.  HENT:     Head: Normocephalic and atraumatic.     Ears:     Comments: Right TM with erythema bulging purulent effusion.  He has a palpable anterior preauricular  lymph node    Mouth/Throat:     Comments: Caries noted to the molars diffusely.  No obvious surrounding erythema pain or edema inside the mouth.  No sublingual swelling.  Mild fullness where I would expect the wisdom teeth but no obvious discomfort.  No trismus.  Tolerating  secretions without difficulty. Eyes:     Pupils: Pupils are equal, round, and reactive to light.  Neck:     Vascular: No JVD.  Cardiovascular:     Rate and Rhythm: Normal rate and regular rhythm.     Heart sounds: No murmur heard.    No friction rub. No gallop.  Pulmonary:     Effort: No respiratory distress.     Breath sounds: No wheezing.  Abdominal:     General: There is no distension.     Tenderness: There is no abdominal tenderness. There is no guarding or rebound.  Musculoskeletal:        General: Normal range of motion.     Cervical back: Normal range of motion and neck supple.  Skin:    Coloration: Skin is not pale.     Findings: No rash.  Neurological:     Mental Status: He is alert and oriented to person, place, and time.  Psychiatric:        Behavior: Behavior normal.     (all labs ordered are listed, but only abnormal results are displayed) Labs Reviewed - No data to display  EKG: None  Radiology: No results found.   Procedures   Medications  Ordered in the ED  amoxicillin-clavulanate (AUGMENTIN) 875-125 MG per tablet 1 tablet (has no administration in time range)                                    Medical Decision Making Risk Prescription drug management.   48 yo M with a chief complaints of right sided facial pain and swelling.  Patient has a lymph node that swollen on the right side.  He also has a break in the skin on that side.  Perhaps this is cellulitis but also has signs of otitis media.  Will start on oral antibiotics.  Have him follow-up with his family doctor in clinic.  4:17 PM:  I have discussed the diagnosis/risks/treatment options with the patient and family.  Evaluation and diagnostic testing in the emergency department does not suggest an emergent condition requiring admission or immediate intervention beyond what has been performed at this time.  They will follow up with PCP. We also discussed returning to the ED immediately if new or  worsening sx occur. We discussed the sx which are most concerning (e.g., sudden worsening pain, fever, inability to tolerate by mouth) that necessitate immediate return. Medications administered to the patient during their visit and any new prescriptions provided to the patient are listed below.  Medications given during this visit Medications  amoxicillin-clavulanate (AUGMENTIN) 875-125 MG per tablet 1 tablet (has no administration in time range)     The patient appears reasonably screen and/or stabilized for discharge and I doubt any other medical condition or other [facility] requiring further screening, evaluation, or treatment in the ED at this time prior to discharge.       Final diagnoses:  Acute suppurative otitis media of right ear without spontaneous rupture of tympanic membrane, recurrence not specified    ED Discharge Orders          Ordered    amoxicillin-clavulanate (AUGMENTIN) 875-125 MG tablet  Every 12 hours        07/16/24 1612               Emil Share, DO 07/16/24 1617

## 2024-07-16 NOTE — ED Notes (Signed)
 Discharge instructions reviewed with patient. Patient verbalizes understanding, no further questions at this time. Medications/prescriptions and follow up information provided. No acute distress noted at time of departure.
